# Patient Record
Sex: Female | Born: 1995 | Race: White | Hispanic: No | Marital: Single | Smoking: Never smoker
Health system: Southern US, Community
[De-identification: ages and names within clinical notes are randomized; demographics above are authoritative.]

## PROBLEM LIST (undated history)

## (undated) ENCOUNTER — Inpatient Hospital Stay (HOSPITAL_COMMUNITY): Payer: Self-pay

## (undated) DIAGNOSIS — M5136 Other intervertebral disc degeneration, lumbar region: Secondary | ICD-10-CM

## (undated) DIAGNOSIS — F4310 Post-traumatic stress disorder, unspecified: Secondary | ICD-10-CM

## (undated) DIAGNOSIS — M51369 Other intervertebral disc degeneration, lumbar region without mention of lumbar back pain or lower extremity pain: Secondary | ICD-10-CM

## (undated) DIAGNOSIS — F329 Major depressive disorder, single episode, unspecified: Secondary | ICD-10-CM

## (undated) DIAGNOSIS — F32A Depression, unspecified: Secondary | ICD-10-CM

## (undated) HISTORY — PX: NO PAST SURGERIES: SHX2092

---
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
## 2015-06-23 ENCOUNTER — Inpatient Hospital Stay (HOSPITAL_COMMUNITY)
Admission: AD | Admit: 2015-06-23 | Discharge: 2015-06-23 | Disposition: A | Discharge status: Home / Self Care | Payer: Medicaid Other | Source: Ambulatory Visit | Attending: Family Medicine | Admitting: Family Medicine

## 2015-06-23 ENCOUNTER — Encounter (HOSPITAL_COMMUNITY): Payer: Self-pay | Admitting: *Deleted

## 2015-06-23 DIAGNOSIS — Z880 Allergy status to penicillin: Secondary | ICD-10-CM | POA: Insufficient documentation

## 2015-06-23 DIAGNOSIS — Z833 Family history of diabetes mellitus: Secondary | ICD-10-CM | POA: Insufficient documentation

## 2015-06-23 DIAGNOSIS — F4310 Post-traumatic stress disorder, unspecified: Secondary | ICD-10-CM | POA: Diagnosis not present

## 2015-06-23 DIAGNOSIS — Z3A22 22 weeks gestation of pregnancy: Secondary | ICD-10-CM | POA: Diagnosis not present

## 2015-06-23 DIAGNOSIS — F329 Major depressive disorder, single episode, unspecified: Secondary | ICD-10-CM | POA: Insufficient documentation

## 2015-06-23 DIAGNOSIS — O219 Vomiting of pregnancy, unspecified: Secondary | ICD-10-CM

## 2015-06-23 DIAGNOSIS — Z8249 Family history of ischemic heart disease and other diseases of the circulatory system: Secondary | ICD-10-CM | POA: Insufficient documentation

## 2015-06-23 DIAGNOSIS — Z825 Family history of asthma and other chronic lower respiratory diseases: Secondary | ICD-10-CM | POA: Diagnosis not present

## 2015-06-23 DIAGNOSIS — O99342 Other mental disorders complicating pregnancy, second trimester: Secondary | ICD-10-CM | POA: Diagnosis not present

## 2015-06-23 DIAGNOSIS — J029 Acute pharyngitis, unspecified: Secondary | ICD-10-CM | POA: Diagnosis present

## 2015-06-23 DIAGNOSIS — E860 Dehydration: Secondary | ICD-10-CM | POA: Diagnosis not present

## 2015-06-23 DIAGNOSIS — J069 Acute upper respiratory infection, unspecified: Secondary | ICD-10-CM | POA: Diagnosis not present

## 2015-06-23 DIAGNOSIS — O26892 Other specified pregnancy related conditions, second trimester: Secondary | ICD-10-CM | POA: Insufficient documentation

## 2015-06-23 HISTORY — DX: Other intervertebral disc degeneration, lumbar region without mention of lumbar back pain or lower extremity pain: M51.369

## 2015-06-23 HISTORY — DX: Other intervertebral disc degeneration, lumbar region: M51.36

## 2015-06-23 LAB — URINALYSIS, ROUTINE W REFLEX MICROSCOPIC
BILIRUBIN URINE: NEGATIVE
Glucose, UA: NEGATIVE mg/dL
HGB URINE DIPSTICK: NEGATIVE
KETONES UR: 15 mg/dL — AB
Leukocytes, UA: NEGATIVE
NITRITE: NEGATIVE
Protein, ur: NEGATIVE mg/dL
Specific Gravity, Urine: >1.03 — ABNORMAL HIGH (ref 1.005–1.030)
pH: 6 (ref 5.0–8.0)

## 2015-06-23 LAB — RAPID STREP SCREEN (MED CTR MEBANE ONLY): STREPTOCOCCUS, GROUP A SCREEN (DIRECT): NEGATIVE

## 2015-06-23 MED ORDER — PRENATAL PLUS 27-1 MG PO TABS: 1.0000 | ORAL_TABLET | Freq: Every day | ORAL | Status: AC

## 2015-06-23 MED ORDER — PROMETHAZINE HCL 12.5 MG PO TABS: 12.5000 mg | ORAL_TABLET | Freq: Four times a day (QID) | ORAL | Status: AC | PRN

## 2015-06-23 MED ORDER — CETIRIZINE HCL 1 MG/ML PO SYRP: 5.0000 mg | ORAL_SOLUTION | Freq: Every day | ORAL | Status: AC

## 2015-06-23 MED ORDER — PROMETHAZINE HCL 25 MG/ML IJ SOLN
25.0000 mg | INTRAVENOUS | Status: DC
  Filled 2015-06-23: qty 1

## 2015-06-23 NOTE — Discharge Instructions (Signed)
Cough, Adult A cough helps to clear your throat and lungs. A cough may last only 2-3 weeks (acute), or it may last longer than 8 weeks (chronic). Many different things can cause a cough. A cough may be a sign of an illness or another medical condition. HOME CARE  Pay attention to any changes in your cough.  Take medicines only as told by your doctor.  If you were prescribed an antibiotic medicine, take it as told by your doctor. Do not stop taking it even if you start to feel better.  Talk with your doctor before you try using a cough medicine.  Drink enough fluid to keep your pee (urine) clear or pale yellow.  If the air is dry, use a cold steam vaporizer or humidifier in your home.  Stay away from things that make you cough at work or at home.  If your cough is worse at night, try using extra pillows to raise your head up higher while you sleep.  Do not smoke, and try not to be around smoke. If you need help quitting, ask your doctor.  Do not have caffeine.  Do not drink alcohol.  Rest as needed. GET HELP IF:  You have new problems (symptoms).  You cough up yellow fluid (pus).  Your cough does not get better after 2-3 weeks, or your cough gets worse.  Medicine does not help your cough and you are not sleeping well.  You have pain that gets worse or pain that is not helped with medicine.  You have a fever.  You are losing weight and you do not know why.  You have night sweats. GET HELP RIGHT AWAY IF:  You cough up blood.  You have trouble breathing.  Your heartbeat is very fast.   This information is not intended to replace advice given to you by your health care provider. Make sure you discuss any questions you have with your health care provider.   Document Released: 03/26/2011 Document Revised: 04/03/2015 Document Reviewed: 09/19/2014 Elsevier Interactive Patient Education 2016 [employer] allaerg Upper Respiratory Infection, Adult Most upper  respiratory infections (URIs) are a viral infection of the air passages leading to the lungs. A URI affects the nose, throat, and upper air passages. The most common type of URI is nasopharyngitis and is typically referred to as "the common cold." URIs run their course and usually go away on their own. Most of the time, a URI does not require medical attention, but sometimes a bacterial infection in the upper airways can follow a viral infection. This is called a secondary infection. Sinus and middle ear infections are common types of secondary upper respiratory infections. Bacterial pneumonia can also complicate a URI. A URI can worsen asthma and chronic obstructive pulmonary disease (COPD). Sometimes, these complications can require emergency medical care and may be life threatening.  CAUSES Almost all URIs are caused by viruses. A virus is a type of germ and can spread from one person to another.  RISKS FACTORS You may be at risk for a URI if:   You smoke.   You have chronic heart or lung disease.  You have a weakened defense (immune) system.   You are very young or very old.   You have nasal allergies or asthma.  You work in crowded or poorly ventilated areas.  You work in health care facilities or schools. SIGNS AND SYMPTOMS  Symptoms typically develop 2-3 days after you come in contact with a cold virus.  Most viral URIs last 7-10 days. However, viral URIs from the influenza virus (flu virus) can last 14-18 days and are typically more severe. Symptoms may include:  °· Runny or stuffy (congested) nose.   °· Sneezing.   °· Cough.   °· Sore throat.   °· Headache.   °· Fatigue.   °· Fever.   °· Loss of appetite.   °· Pain in your forehead, behind your eyes, and over your cheekbones (sinus pain). °· Muscle aches.   °DIAGNOSIS  °Your health care provider may diagnose a URI by: °· Physical exam. °· Tests to check that your symptoms are not due to another condition such as: °¨ Strep  throat. °¨ Sinusitis. °¨ Pneumonia. °¨ Asthma. °TREATMENT  °A URI goes away on its own with time. It cannot be cured with medicines, but medicines may be prescribed or recommended to relieve symptoms. Medicines may help: °· Reduce your fever. °· Reduce your cough. °· Relieve nasal congestion. °HOME CARE INSTRUCTIONS  °· Take medicines only as directed by your health care provider.   °· Gargle warm saltwater or take cough drops to comfort your throat as directed by your health care provider. °· Use a warm mist humidifier or inhale steam from a shower to increase air moisture. This may make it easier to breathe. °· Drink enough fluid to keep your urine clear or pale yellow.   °· Eat soups and other clear broths and maintain good nutrition.   °· Rest as needed.   °· Return to work when your temperature has returned to normal or as your health care provider advises. You may need to stay home longer to avoid infecting others. You can also use a face mask and careful hand washing to prevent spread of the virus. °· Increase the usage of your inhaler if you have asthma.   °· Do not use any tobacco products, including cigarettes, chewing tobacco, or electronic cigarettes. If you need help quitting, ask your health care provider. °PREVENTION  °The best way to protect yourself from getting a cold is to practice good hygiene.  °· Avoid oral or hand contact with people with cold symptoms.   °· Wash your hands often if contact occurs.   °There is no clear evidence that vitamin C, vitamin E, echinacea, or exercise reduces the chance of developing a cold. However, it is always recommended to get plenty of rest, exercise, and practice good nutrition.  °SEEK MEDICAL CARE IF:  °· You are getting worse rather than better.   °· Your symptoms are not controlled by medicine.   °· You have chills. °· You have worsening shortness of breath. °· You have brown or red mucus. °· You have yellow or brown nasal discharge. °· You have pain in your  face, especially when you bend forward. °· You have a fever. °· You have swollen neck glands. °· You have pain while swallowing. °· You have white areas in the back of your throat. °SEEK IMMEDIATE MEDICAL CARE IF:  °· You have severe or persistent: °¨ Headache. °¨ Ear pain. °¨ Sinus pain. °¨ Chest pain. °· You have chronic lung disease and any of the following: °¨ Wheezing. °¨ Prolonged cough. °¨ Coughing up blood. °¨ A change in your usual mucus. °· You have a stiff neck. °· You have changes in your: °¨ Vision. °¨ Hearing. °¨ Thinking. °¨ Mood. °MAKE SURE YOU:  °· Understand these instructions. °· Will watch your condition. °· Will get help right away if you are not doing well or get worse. °  °This information is not intended to replace   advice given to you by your health care provider. Make sure you discuss any questions you have with your health care provider. °  °Document Released: 01/06/2001 Document Revised: 11/27/2014 Document Reviewed: 10/18/2013 °Elsevier Interactive Patient Education ©2016 [employer] ° °

## 2015-06-23 NOTE — MAU Provider Note (Signed)
History     CSN: [id number]  Arrival date and time: 06/23/15 1213   First Provider Initiated Contact with Patient 06/23/15 1312      Chief Complaint  Patient presents with  . Possible Pregnancy  . Nasal Congestion   HPI Comments: Alejandra Pena is a 19 y.o. G1P0 at [redacted]w[redacted]d by LMP 03/23/15 presenting with sinus congestion and sore throat for 1 week. She reports pain over maxillary sinuses an headache located behind her eyes .She states her tonsils were enlarged and she had pain with swallowing last week. Denies fever or chills. She has seasonal respiratory allergies. Denies strep contact. HPT positive. She's had nausea for several weeks with occasional vomiting, but omited 3 times so far today and has retained no food or fluids since yesterday.  Denies abdominal pain or vaginal bleeding. No prenatal care.      Past Medical History  Diagnosis Date  . PTSD (post-traumatic stress disorder)   . Depression   . Degenerative lumbar disc     Past Surgical History  Procedure Laterality Date  . No past surgeries      Family History  Problem Relation Age of Onset  . Diabetes Maternal Grandmother   . Heart disease Maternal Grandmother   . COPD Maternal Grandmother     Social History  Substance Use Topics  . Smoking status: Never Smoker   . Smokeless tobacco: Never Used  . Alcohol Use: No    Allergies:  Allergies  Allergen Reactions  . Bee Venom   . Celecoxib Hives  . Penicillins     Has patient had a PCN reaction causing immediate rash, facial/tongue/throat swelling, SOB or lightheadedness with hypotension: No Has patient had a PCN reaction causing severe rash involving mucus membranes or skin necrosis: Yes Has patient had a PCN reaction that required hospitalization No Has patient had a PCN reaction occurring within the last 10 years: No If all of the above answers are "NO", then may proceed with Cephalosporin use.  Caryl Never [Shellfish Allergy]     Prescriptions prior  to admission  Medication Sig Dispense Refill Last Dose  . azithromycin (ZITHROMAX Z-PAK) 250 MG tablet Take as directed on pack 6 each 0   . chlorpheniramine-HYDROcodone (TUSSIONEX PENNKINETIC ER) 10-8 MG/5ML LQCR Take 5 mLs by mouth every 12 (twelve) hours. For cough 115 mL 0   . cyclobenzaprine (FLEXERIL) 5 MG tablet Take 1 tablet (5 mg total) by mouth 3 (three) times daily as needed for muscle spasms. 30 tablet 0   . FLUoxetine (PROZAC) 10 MG capsule Take 5 mg by mouth daily.    06/08/2012 at Unknown  . fluticasone (FLONASE) 50 MCG/ACT nasal spray Place 2 sprays into the nose daily. 16 g 0   . HYDROcodone-acetaminophen (NORCO/VICODIN) 5-325 MG per tablet 1 to 2 tabs every 4 to 6 hours as needed for pain. 20 tablet 0   . ibuprofen (ADVIL,MOTRIN) 200 MG tablet Take 200 mg by mouth every 6 (six) hours as needed. For pain     . ipratropium (ATROVENT) 0.06 % nasal spray Place 2 sprays into the nose 4 (four) times daily. 15 mL 1   . meloxicam (MOBIC) 15 MG tablet Take 1 tablet (15 mg total) by mouth daily. 15 tablet 0   . norgestimate-ethinyl estradiol (ORTHO-CYCLEN,SPRINTEC,PREVIFEM) 0.25-35 MG-MCG tablet Take 1 tablet by mouth daily.   06/08/2012 at Unknown  . ondansetron (ZOFRAN ODT) 8 MG disintegrating tablet Take 1 tablet (8 mg total) by mouth every 8 (eight) hours  as needed for nausea. 20 tablet 0   . ondansetron (ZOFRAN) 4 MG tablet Take 1 tablet (4 mg total) by mouth every 6 (six) hours. 6 tablet 0   . sulfamethoxazole-trimethoprim (SEPTRA DS) 800-160 MG per tablet Take 1 tablet by mouth every 12 (twelve) hours. 10 tablet 0     Review of Systems  Respiratory: Negative for sputum production, shortness of breath and wheezing.   Gastrointestinal: Negative for heartburn.  Genitourinary: Negative for dysuria, urgency, frequency, hematuria and flank pain.  Psychiatric/Behavioral: Negative for depression. The patient is nervous/anxious.    Physical Exam   Blood pressure 133/87, pulse 129,  temperature 98.6 F (37 C), resp. rate 18, height  (1.626 m), weight 122 lb (55.339 kg), last menstrual period 03/21/2015. Filed Vitals:   06/23/15 1230 06/23/15 1423  BP: 133/87 126/83  Pulse: 129 109  Temp: 98.6 F (37 C) 98.8 F (37.1 C)  TempSrc:  Oral  Resp: 18 18  Height:  (1.626 m)   Weight: 122 lb (55.339 kg)    Physical Exam  Nursing note and vitals reviewed. Constitutional: She is oriented to person, place, and time. She appears well-developed and well-nourished. No distress.  HENT:  Head: Normocephalic.  Eyes: Pupils are equal, round, and reactive to light.  Neck: Neck supple. No thyromegaly present.  Shoddy anterior cervical lymphadenopathy. Tonsils +2 slightly inflamed with small area of exudate on left. Strep screen done.  Cardiovascular: Regular rhythm and normal heart sounds.   No murmur heard. tachycardic  Respiratory: Effort normal and breath sounds normal. No respiratory distress. She has no wheezes. She has no rales. She exhibits no tenderness.  GI: Soft. She exhibits no distension. There is no tenderness.  Fundus at one third to umbi;icus consistent with 12-14 weeks' size. Doptone FHR 164  Musculoskeletal: Normal range of motion.  Lymphadenopathy:    She has cervical adenopathy.  Neurological: She is alert and oriented to person, place, and time.  Skin: Skin is warm and dry.    MAU Course  Procedures Results for orders placed or performed during the hospital encounter of 06/23/15 (from the past 24 hour(s))  Urinalysis, Routine w reflex microscopic (not at [facility])     Status: Abnormal   Collection Time: 06/23/15 12:40 PM  Result Value Ref Range   Color, Urine YELLOW YELLOW   APPearance CLEAR CLEAR   Specific Gravity, Urine >1.030 (H) 1.005 - 1.030   pH 6.0 5.0 - 8.0   Glucose, UA NEGATIVE NEGATIVE mg/dL   Hgb urine dipstick NEGATIVE NEGATIVE   Bilirubin Urine NEGATIVE NEGATIVE   Ketones, ur 15 (A) NEGATIVE mg/dL   Protein, ur NEGATIVE  NEGATIVE mg/dL   Nitrite NEGATIVE NEGATIVE   Leukocytes, UA NEGATIVE NEGATIVE   MDM Probable viral/allergic UR sx. Will r/o strep pharyngitis with rapid strep of oropharynx Refused IV for rehydration and declined oral antiemetic here. Retained fluids and tachycardia improved   Assessment and Plan  Acute upper respiratory infection - Plan: Discharge patient  Nausea and vomiting during pregnancy prior to [redacted] weeks gestation - Plan: Discharge patient  Mild dehydration    Medication List    STOP taking these medications        azithromycin 250 MG tablet  Commonly known as:  ZITHROMAX Z-PAK     chlorpheniramine-HYDROcodone 10-8 MG/5ML Lqcr  Commonly known as:  TUSSIONEX PENNKINETIC ER     cyclobenzaprine 5 MG tablet  Commonly known as:  FLEXERIL     FLUoxetine 10 MG capsule  Commonly known as:  PROZAC     fluticasone 50 MCG/ACT nasal spray  Commonly known as:  FLONASE     HYDROcodone-acetaminophen 5-325 MG tablet  Commonly known as:  NORCO/VICODIN     ipratropium 0.06 % nasal spray  Commonly known as:  ATROVENT     meloxicam 15 MG tablet  Commonly known as:  MOBIC     ondansetron 4 MG tablet  Commonly known as:  ZOFRAN     ondansetron 8 MG disintegrating tablet  Commonly known as:  ZOFRAN ODT     sulfamethoxazole-trimethoprim 800-160 MG tablet  Commonly known as:  SEPTRA DS      TAKE these medications        acetaminophen 500 MG tablet  Commonly known as:  TYLENOL  Take 500 mg by mouth every 6 (six) hours as needed for moderate pain.     cetirizine 1 MG/ML syrup  Commonly known as:  ZYRTEC  Take 5 mLs (5 mg total) by mouth daily.     prenatal vitamin w/FE, FA 27-1 MG Tabs tablet  Take 1 tablet by mouth daily.     promethazine 12.5 MG tablet  Commonly known as:  PHENERGAN  Take 1 tablet (12.5 mg total) by mouth every 6 (six) hours as needed for nausea or vomiting.       Follow-up Information    [facility] Schedule an  appointment as soon as possible for a visit in 1 week.   Contact information:   [address] [place] [id number] [phone]       Gladys Gutman 06/23/2015, 1:16 PM

## 2015-06-23 NOTE — MAU Note (Signed)
Pt presents to MAU with complaints of a sore throat and sinus drainage. Pt also states that she has missed her menstrual cycle. Denies any vaginal bleeding or abnormal discharge

## 2015-06-25 LAB — CULTURE, GROUP A STREP: Strep A Culture: NEGATIVE

## 2016-04-27 ENCOUNTER — Encounter (HOSPITAL_COMMUNITY): Payer: Self-pay
